# Patient Record
Sex: Male | Born: 1955 | Race: White | Hispanic: No | Marital: Married
Health system: Midwestern US, Academic
[De-identification: ages and names within clinical notes are randomized; demographics above are authoritative.]

---

## 2017-08-30 LAB — COMPREHENSIVE METABOLIC PANEL
Lab: 15 — ABNORMAL HIGH (ref 0–14)
Lab: 23
Lab: 33
Lab: 4
Lab: 76

## 2017-09-03 ENCOUNTER — Encounter: Admit: 2017-09-03 | Discharge: 2017-09-03 | Payer: BC Managed Care – PPO

## 2017-09-03 DIAGNOSIS — I10 Essential (primary) hypertension: Principal | ICD-10-CM

## 2017-09-09 ENCOUNTER — Encounter: Admit: 2017-09-09 | Discharge: 2017-09-09 | Payer: BC Managed Care – PPO

## 2017-09-10 ENCOUNTER — Encounter: Admit: 2017-09-10 | Discharge: 2017-09-10 | Payer: BC Managed Care – PPO

## 2017-09-10 ENCOUNTER — Ambulatory Visit: Admit: 2017-09-10 | Discharge: 2017-09-11

## 2017-09-10 DIAGNOSIS — I10 Essential (primary) hypertension: Principal | ICD-10-CM

## 2017-09-11 ENCOUNTER — Encounter: Admit: 2017-09-11 | Discharge: 2017-09-11 | Payer: BC Managed Care – PPO

## 2017-09-11 DIAGNOSIS — E785 Hyperlipidemia, unspecified: ICD-10-CM

## 2017-09-11 DIAGNOSIS — I10 Essential (primary) hypertension: Principal | ICD-10-CM

## 2017-09-17 ENCOUNTER — Ambulatory Visit: Admit: 2017-09-17 | Discharge: 2017-09-18

## 2017-09-17 ENCOUNTER — Encounter: Admit: 2017-09-17 | Discharge: 2017-09-17 | Payer: BC Managed Care – PPO

## 2017-09-17 DIAGNOSIS — I10 Essential (primary) hypertension: Principal | ICD-10-CM

## 2017-09-17 DIAGNOSIS — E7801 Familial hypercholesterolemia: ICD-10-CM

## 2017-09-17 DIAGNOSIS — E785 Hyperlipidemia, unspecified: ICD-10-CM

## 2017-09-17 NOTE — Assessment & Plan Note
His average blood pressure at home is just over 130/80.  He will start a regular exercise program at the [facility] and I told him I thought it was reasonable to use lifestyle modification initially but that the goal for his blood pressure will be less than 130/80.

## 2017-09-17 NOTE — Progress Notes
Date of Service: 09/17/2017    Fred Miller is a 61 y.o. male.       HPI     Fred Miller was in the [place] office today for consultation regarding cardiovascular risk management.  His father is a patient I have seen in the hospital at [place] and Fred Miller does have a significant family history of vascular disease.    His recent lipid profile showed that his LDL cholesterol had gone up to over 190 and Dr. Andreas Newport started the patient on Crestor about a week ago.    The patient has had mildly elevated blood pressure readings in the doctor's office.  He got a home blood pressure monitor and brought in a record of his readings.  The average of his blood pressures at home is just over 130/80.    I do not think the patient is having any symptoms at this time he denies any problems with chest discomfort or breathlessness.  He has had no palpitations, syncope, or near syncope.  He denies claudication or TIA symptoms.    He readily admits that he does not get any exercise and we talked about having him engage the services of a personal trainer out at the [facility].           Vitals:    09/17/17 1419 09/17/17 1429   BP: (!) 148/98 (!) 150/94   Pulse: 64    Weight: 77.7 kg (171 lb 3.2 oz)    Height: 1.753 m (5' 9)      Body mass index is 25.28 kg/m???.     Past Medical History  Patient Active Problem List    Diagnosis Date Noted   ??? Essential hypertension 09/11/2017   ??? Hyperlipemia 09/11/2017         Review of Systems   Constitution: Negative.   HENT: Negative.    Eyes: Negative.    Cardiovascular: Negative.    Respiratory: Negative.    Endocrine: Negative.    Hematologic/Lymphatic: Negative.    Skin: Negative.    Musculoskeletal: Negative.    Gastrointestinal: Negative.    Genitourinary: Negative.    Neurological: Negative.    Psychiatric/Behavioral: Negative.    Allergic/Immunologic: Negative.        Physical Exam    Physical Exam   General Appearance: no distress   Skin: warm, no ulcers or xanthomas Digits and Nails: no cyanosis or clubbing   Eyes: conjunctivae and lids normal, pupils are equal and round   Teeth/Gums/Palate: dentition unremarkable, no lesions   Lips & Oral Mucosa: no pallor or cyanosis   Neck Veins: normal JVP , neck veins are not distended   Thyroid: no nodules, masses, tenderness or enlargement   Chest Inspection: chest is normal in appearance   Respiratory Effort: breathing comfortably, no respiratory distress   Auscultation/Percussion: lungs clear to auscultation, no rales or rhonchi, no wheezing   PMI: PMI not enlarged or displaced   Cardiac Rhythm: regular rhythm and normal rate   Cardiac Auscultation: S1, S2 normal, no rub, no gallop   Murmurs: no murmur   Peripheral Circulation: normal peripheral circulation   Carotid Arteries: normal carotid upstroke bilaterally, no bruits   Radial Arteries: normal symmetric radial pulses   Abdominal Aorta: no abdominal aortic bruit   Pedal Pulses: normal symmetric pedal pulses   Lower Extremity Edema: no lower extremity edema   Abdominal Exam: soft, non-tender, no masses, bowel sounds normal   Liver & Spleen: no organomegaly   Gait & Station:  walks without assistance   Muscle Strength: normal muscle tone   Orientation: oriented to time, place and person   Affect & Mood: appropriate and sustained affect   Language and Memory: patient responsive and seems to comprehend information   Neurologic Exam: neurological assessment grossly intact   Other: moves all extremities      Cardiovascular Studies    EKG:  Sinus rhythm, rate 64.  Normal tracing.    Problems Addressed Today  Encounter Diagnoses   Name Primary?   ??? Essential hypertension    ??? Familial hypercholesterolemia        Assessment and Plan       Essential hypertension  His average blood pressure at home is just over 130/80.  He will start a regular exercise program at the [facility] and I told him I thought it was reasonable to use lifestyle modification initially but that the goal for his blood pressure will be less than 130/80.    Hyperlipemia  I told him that I certainly agree with the use of a statin for his LDL cholesterol of higher than 190.    I also talked to him about using a coronary calcium scan as a risk stratification tool.  If he does have a significant amount of calcification we should probably target his LDL as low as 70.  If his total calcium score is higher than 400 I told him that I would recommend a stress test, but if the calcium score is lower than that I do not think we necessarily need to use a stress test because he is not having symptoms currently.      Current Medications (including today's revisions)  ??? aspirin EC 81 mg tablet Take 81 mg by mouth daily. Take with food.   ??? cholecalciferol (VITAMIN D-3) 400 unit tab tablet Take 400 Units by mouth daily.   ??? cyanocobalamin (VITAMIN B-12) 100 mcg tablet Take 100 mcg by mouth daily.   ??? folic acid/multivit-min/lutein (CENTRUM SILVER PO) Take 1 tablet by mouth daily.   ??? rosuvastatin (CRESTOR) 10 mg tablet Take 1 tablet by mouth daily.

## 2017-09-17 NOTE — Assessment & Plan Note
I told him that I certainly agree with the use of a statin for his LDL cholesterol of higher than 190.    I also talked to him about using a coronary calcium scan as a risk stratification tool.  If he does have a significant amount of calcification we should probably target his LDL as low as 70.  If his total calcium score is higher than 400 I told him that I would recommend a stress test, but if the calcium score is lower than that I do not think we necessarily need to use a stress test because he is not having symptoms currently.

## 2018-03-24 ENCOUNTER — Ambulatory Visit: Admit: 2018-03-24 | Discharge: 2018-03-24 | Payer: BC Managed Care – PPO

## 2018-03-24 ENCOUNTER — Encounter: Admit: 2018-03-24 | Discharge: 2018-03-24 | Payer: BC Managed Care – PPO

## 2018-03-24 DIAGNOSIS — E872 Acidosis: Secondary | ICD-10-CM

## 2018-03-24 DIAGNOSIS — E785 Hyperlipidemia, unspecified: Principal | ICD-10-CM

## 2018-03-24 DIAGNOSIS — W1789XA Other fall from one level to another, initial encounter: ICD-10-CM

## 2018-03-24 MED ORDER — ONDANSETRON HCL (PF) 4 MG/2 ML IJ SOLN
4 mg | INTRAVENOUS | 0 refills | Status: DC | PRN
Start: 2018-03-24 — End: 2018-03-26

## 2018-03-24 MED ORDER — IOHEXOL 350 MG IODINE/ML IV SOLN
100 mL | Freq: Once | INTRAVENOUS | 0 refills | Status: CP
Start: 2018-03-24 — End: ?
  Administered 2018-03-25: 02:00:00 100 mL via INTRAVENOUS

## 2018-03-24 MED ORDER — SODIUM CHLORIDE 0.9 % IJ SOLN
50 mL | Freq: Once | INTRAVENOUS | 0 refills | Status: CP
Start: 2018-03-24 — End: ?
  Administered 2018-03-25: 02:00:00 50 mL via INTRAVENOUS

## 2018-03-24 MED ORDER — FENTANYL CITRATE (PF) 50 MCG/ML IJ SOLN
25-50 ug | INTRAVENOUS | 0 refills | Status: DC | PRN
Start: 2018-03-24 — End: 2018-03-25

## 2018-03-24 MED ORDER — FENTANYL CITRATE (PF) 50 MCG/ML IJ SOLN
50 ug | Freq: Once | INTRAVENOUS | 0 refills | Status: CP
Start: 2018-03-24 — End: ?
  Administered 2018-03-25: 02:00:00 50 ug via INTRAVENOUS

## 2018-03-24 MED ORDER — LACTATED RINGERS IV SOLP
INTRAVENOUS | 0 refills | Status: DC
Start: 2018-03-24 — End: 2018-03-25
  Administered 2018-03-25: 06:00:00 1000.000 mL via INTRAVENOUS

## 2018-03-24 MED ORDER — LACTATED RINGERS IV SOLP
1000 mL | INTRAVENOUS | 0 refills | Status: CP
Start: 2018-03-24 — End: ?
  Administered 2018-03-25: 03:00:00 1000 mL via INTRAVENOUS

## 2018-03-25 ENCOUNTER — Encounter: Admit: 2018-03-25 | Discharge: 2018-03-25 | Payer: BC Managed Care – PPO

## 2018-03-25 ENCOUNTER — Emergency Department: Admit: 2018-03-25 | Discharge: 2018-03-25 | Payer: BC Managed Care – PPO

## 2018-03-25 ENCOUNTER — Inpatient Hospital Stay: Admit: 2018-03-25 | Discharge: 2018-03-25 | Payer: BC Managed Care – PPO

## 2018-03-25 ENCOUNTER — Inpatient Hospital Stay
Admit: 2018-03-25 | Discharge: 2018-03-25 | Disposition: A | Discharge status: Home / Self Care | Payer: BC Managed Care – PPO | Source: Other Acute Inpatient Hospital

## 2018-03-25 ENCOUNTER — Emergency Department: Admit: 2018-03-24 | Discharge: 2018-03-24 | Payer: BC Managed Care – PPO

## 2018-03-25 ENCOUNTER — Ambulatory Visit: Admit: 2018-03-25 | Discharge: 2018-03-25 | Payer: BC Managed Care – PPO

## 2018-03-25 DIAGNOSIS — S22069A Unspecified fracture of T7-T8 vertebra, initial encounter for closed fracture: ICD-10-CM

## 2018-03-25 DIAGNOSIS — G8911 Acute pain due to trauma: ICD-10-CM

## 2018-03-25 DIAGNOSIS — S0121XA Laceration without foreign body of nose, initial encounter: ICD-10-CM

## 2018-03-25 DIAGNOSIS — R40241 Glasgow coma scale score 13-15, unspecified time: ICD-10-CM

## 2018-03-25 DIAGNOSIS — S0101XA Laceration without foreign body of scalp, initial encounter: ICD-10-CM

## 2018-03-25 DIAGNOSIS — S12100A Unspecified displaced fracture of second cervical vertebra, initial encounter for closed fracture: ICD-10-CM

## 2018-03-25 DIAGNOSIS — S01511A Laceration without foreign body of lip, initial encounter: ICD-10-CM

## 2018-03-25 DIAGNOSIS — E872 Acidosis: ICD-10-CM

## 2018-03-25 DIAGNOSIS — S3991XA Unspecified injury of abdomen, initial encounter: ICD-10-CM

## 2018-03-25 DIAGNOSIS — E785 Hyperlipidemia, unspecified: ICD-10-CM

## 2018-03-25 DIAGNOSIS — S0181XA Laceration without foreign body of other part of head, initial encounter: ICD-10-CM

## 2018-03-25 DIAGNOSIS — I10 Essential (primary) hypertension: ICD-10-CM

## 2018-03-25 DIAGNOSIS — S12300A Unspecified displaced fracture of fourth cervical vertebra, initial encounter for closed fracture: Principal | ICD-10-CM

## 2018-03-25 DIAGNOSIS — M25511 Pain in right shoulder: ICD-10-CM

## 2018-03-25 DIAGNOSIS — S022XXA Fracture of nasal bones, initial encounter for closed fracture: ICD-10-CM

## 2018-03-25 LAB — BETA-HCG: Lab: <1 U/L — ABNORMAL HIGH (ref <5)

## 2018-03-25 LAB — BASIC METABOLIC PANEL
Lab: 1 mg/dL (ref 0.4–1.24)
Lab: 140 MMOL/L (ref 137–147)
Lab: 144 mg/dL — ABNORMAL HIGH (ref 70–100)
Lab: 20 mg/dL (ref 7–25)
Lab: 23 MMOL/L (ref 21–30)
Lab: 8.9 mg/dL (ref 8.5–10.6)
Lab: >60 mL/min (ref >60)
Lab: >60 mL/min (ref >60)
Lab: 0.9 mg/dL — ABNORMAL LOW (ref >60)
Lab: 10 g/dL — ABNORMAL LOW (ref 3–12)
Lab: 103 MMOL/L — ABNORMAL LOW (ref >60)
Lab: 138 MMOL/L — ABNORMAL LOW (ref 137–147)
Lab: 165 mg/dL — ABNORMAL HIGH (ref >60)
Lab: 8.8 mg/dL — ABNORMAL HIGH (ref >60)
Lab: >60 mL/min (ref >60)
Lab: >60 mL/min — ABNORMAL LOW (ref >60)

## 2018-03-25 LAB — PHOSPHORUS: Lab: 3.8 mg/dL (ref >60)

## 2018-03-25 LAB — MAGNESIUM: Lab: 1.6 mg/dL — ABNORMAL LOW (ref 1.6–2.6)

## 2018-03-25 LAB — ALCOHOL LEVEL: Lab: 31 mg/dL (ref 98–110)

## 2018-03-25 LAB — LACTIC ACID (BG - RAPID LACTATE): Lab: 4.3 MMOL/L — ABNORMAL HIGH (ref 0.5–2.0)

## 2018-03-25 LAB — CBC
Lab: 12 10*3/uL — ABNORMAL HIGH (ref 4.5–11.0)
Lab: 8.2 10*3/uL — ABNORMAL HIGH (ref >60)

## 2018-03-25 LAB — LACTIC ACID(LACTATE): Lab: 1.9 MMOL/L (ref 0.5–2.0)

## 2018-03-25 LAB — PROTIME INR (PT): Lab: 1 M/UL (ref 0.8–1.2)

## 2018-03-25 LAB — PTT (APTT): Lab: 23 s — ABNORMAL LOW (ref 24.0–36.5)

## 2018-03-25 MED ORDER — SENNOSIDES 8.6 MG PO TAB
1 | ORAL_TABLET | Freq: Two times a day (BID) | ORAL | 3 refills | Status: AC
Start: 2018-03-25 — End: 2018-04-08

## 2018-03-25 MED ORDER — POLYETHYLENE GLYCOL 3350 17 GRAM PO PWPK
17 g | Freq: Every day | ORAL | 0 refills | 18.00000 days | Status: AC
Start: 2018-03-25 — End: 2018-04-08

## 2018-03-25 MED ORDER — POLYETHYLENE GLYCOL 3350 17 GRAM PO PWPK
1 | Freq: Every day | ORAL | 0 refills | Status: DC
Start: 2018-03-25 — End: 2018-03-26

## 2018-03-25 MED ORDER — TRAMADOL 50 MG PO TAB
50 mg | ORAL | 0 refills | Status: DC | PRN
Start: 2018-03-25 — End: 2018-03-26
  Administered 2018-03-25: 21:00:00 50 mg via ORAL

## 2018-03-25 MED ORDER — ENOXAPARIN 30 MG/0.3 ML SC SYRG
30 mg | Freq: Two times a day (BID) | SUBCUTANEOUS | 0 refills | Status: DC
Start: 2018-03-25 — End: 2018-03-26
  Administered 2018-03-25: 14:00:00 30 mg via SUBCUTANEOUS

## 2018-03-25 MED ORDER — ACETAMINOPHEN 325 MG PO TAB
650 mg | ORAL | 0 refills | Status: AC | PRN
Start: 2018-03-25 — End: ?

## 2018-03-25 MED ORDER — TRAMADOL 50 MG PO TAB
50 mg | ORAL_TABLET | ORAL | 0 refills | Status: AC | PRN
Start: 2018-03-25 — End: 2018-05-06
  Filled 2018-03-25 (×2): qty 30, 7d supply, fill #1

## 2018-03-25 MED ORDER — SENNOSIDES 8.6 MG PO TAB
1 | Freq: Two times a day (BID) | ORAL | 0 refills | Status: DC
Start: 2018-03-25 — End: 2018-03-26
  Administered 2018-03-25: 14:00:00 1 via ORAL

## 2018-03-25 MED ORDER — DIPHENHYDRAMINE HCL 50 MG/ML IJ SOLN
50 mg | Freq: Once | INTRAVENOUS | 0 refills | Status: DC
Start: 2018-03-25 — End: 2018-03-25

## 2018-03-25 MED ORDER — ACETAMINOPHEN 325 MG PO TAB
650 mg | ORAL | 0 refills | Status: DC
Start: 2018-03-25 — End: 2018-03-26
  Administered 2018-03-25 (×2): 650 mg via ORAL

## 2018-03-26 ENCOUNTER — Encounter: Admit: 2018-03-26 | Discharge: 2018-03-26 | Payer: BC Managed Care – PPO

## 2018-03-26 DIAGNOSIS — I10 Essential (primary) hypertension: Principal | ICD-10-CM

## 2018-03-26 DIAGNOSIS — E785 Hyperlipidemia, unspecified: Secondary | ICD-10-CM

## 2018-04-04 ENCOUNTER — Encounter: Admit: 2018-04-04 | Discharge: 2018-04-04 | Payer: BC Managed Care – PPO

## 2018-04-04 DIAGNOSIS — W1789XA Other fall from one level to another, initial encounter: Principal | ICD-10-CM

## 2018-04-08 ENCOUNTER — Ambulatory Visit: Admit: 2018-04-08 | Discharge: 2018-04-08 | Payer: BC Managed Care – PPO

## 2018-04-08 DIAGNOSIS — S22060B Wedge compression fracture of T7-T8 vertebra, initial encounter for open fracture: Principal | ICD-10-CM

## 2018-04-08 DIAGNOSIS — W1789XA Other fall from one level to another, initial encounter: ICD-10-CM

## 2018-04-15 ENCOUNTER — Ambulatory Visit: Admit: 2018-04-15 | Discharge: 2018-04-15 | Payer: BC Managed Care – PPO

## 2018-04-15 ENCOUNTER — Encounter: Admit: 2018-04-15 | Discharge: 2018-04-15 | Payer: BC Managed Care – PPO

## 2018-04-15 DIAGNOSIS — I10 Essential (primary) hypertension: Principal | ICD-10-CM

## 2018-04-15 DIAGNOSIS — E785 Hyperlipidemia, unspecified: ICD-10-CM

## 2018-04-15 DIAGNOSIS — Z4802 Encounter for removal of sutures: ICD-10-CM

## 2018-04-15 DIAGNOSIS — Z5189 Encounter for other specified aftercare: Principal | ICD-10-CM

## 2018-04-15 MED ORDER — BACITRACIN ZINC 500 UNIT/GRAM TP OINT
Freq: Every day | TOPICAL | 0 refills | 14.00000 days | Status: AC
Start: 2018-04-15 — End: 2018-05-06

## 2018-04-15 MED ORDER — CEPHALEXIN 500 MG PO CAP
500 mg | ORAL_CAPSULE | Freq: Three times a day (TID) | ORAL | 0 refills | Status: AC
Start: 2018-04-15 — End: ?

## 2018-05-01 ENCOUNTER — Encounter: Admit: 2018-05-01 | Discharge: 2018-05-01 | Payer: BC Managed Care – PPO

## 2018-05-01 DIAGNOSIS — S22060B Wedge compression fracture of T7-T8 vertebra, initial encounter for open fracture: Principal | ICD-10-CM

## 2018-05-06 ENCOUNTER — Ambulatory Visit: Admit: 2018-05-06 | Discharge: 2018-05-06 | Payer: BC Managed Care – PPO

## 2018-05-06 ENCOUNTER — Encounter: Admit: 2018-05-06 | Discharge: 2018-05-06 | Payer: BC Managed Care – PPO

## 2018-05-06 DIAGNOSIS — S22060B Wedge compression fracture of T7-T8 vertebra, initial encounter for open fracture: Principal | ICD-10-CM

## 2018-05-06 DIAGNOSIS — I10 Essential (primary) hypertension: Principal | ICD-10-CM

## 2018-05-06 DIAGNOSIS — E785 Hyperlipidemia, unspecified: ICD-10-CM

## 2018-07-16 IMAGING — CR CHEST
2 series · 2 of 2 positions shown · non-contrast
Comparison: none

[chest pa x-wise]
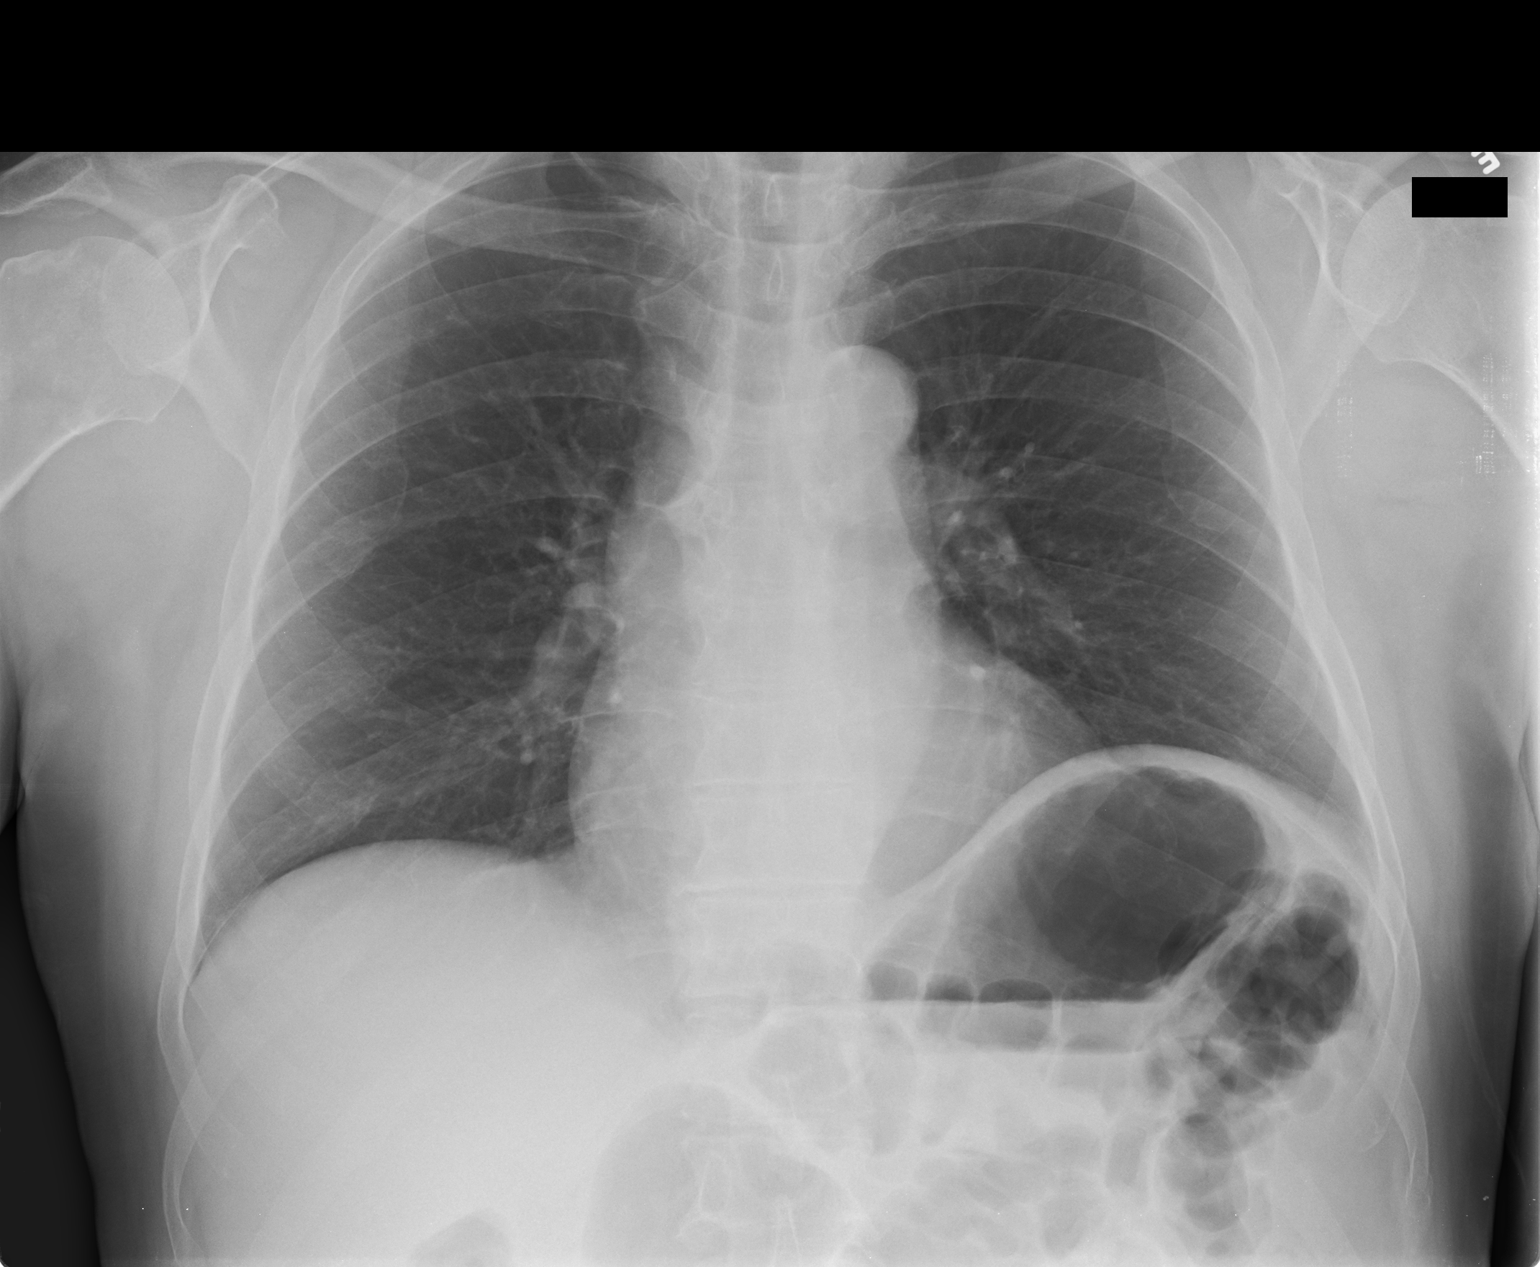

[chest lat]
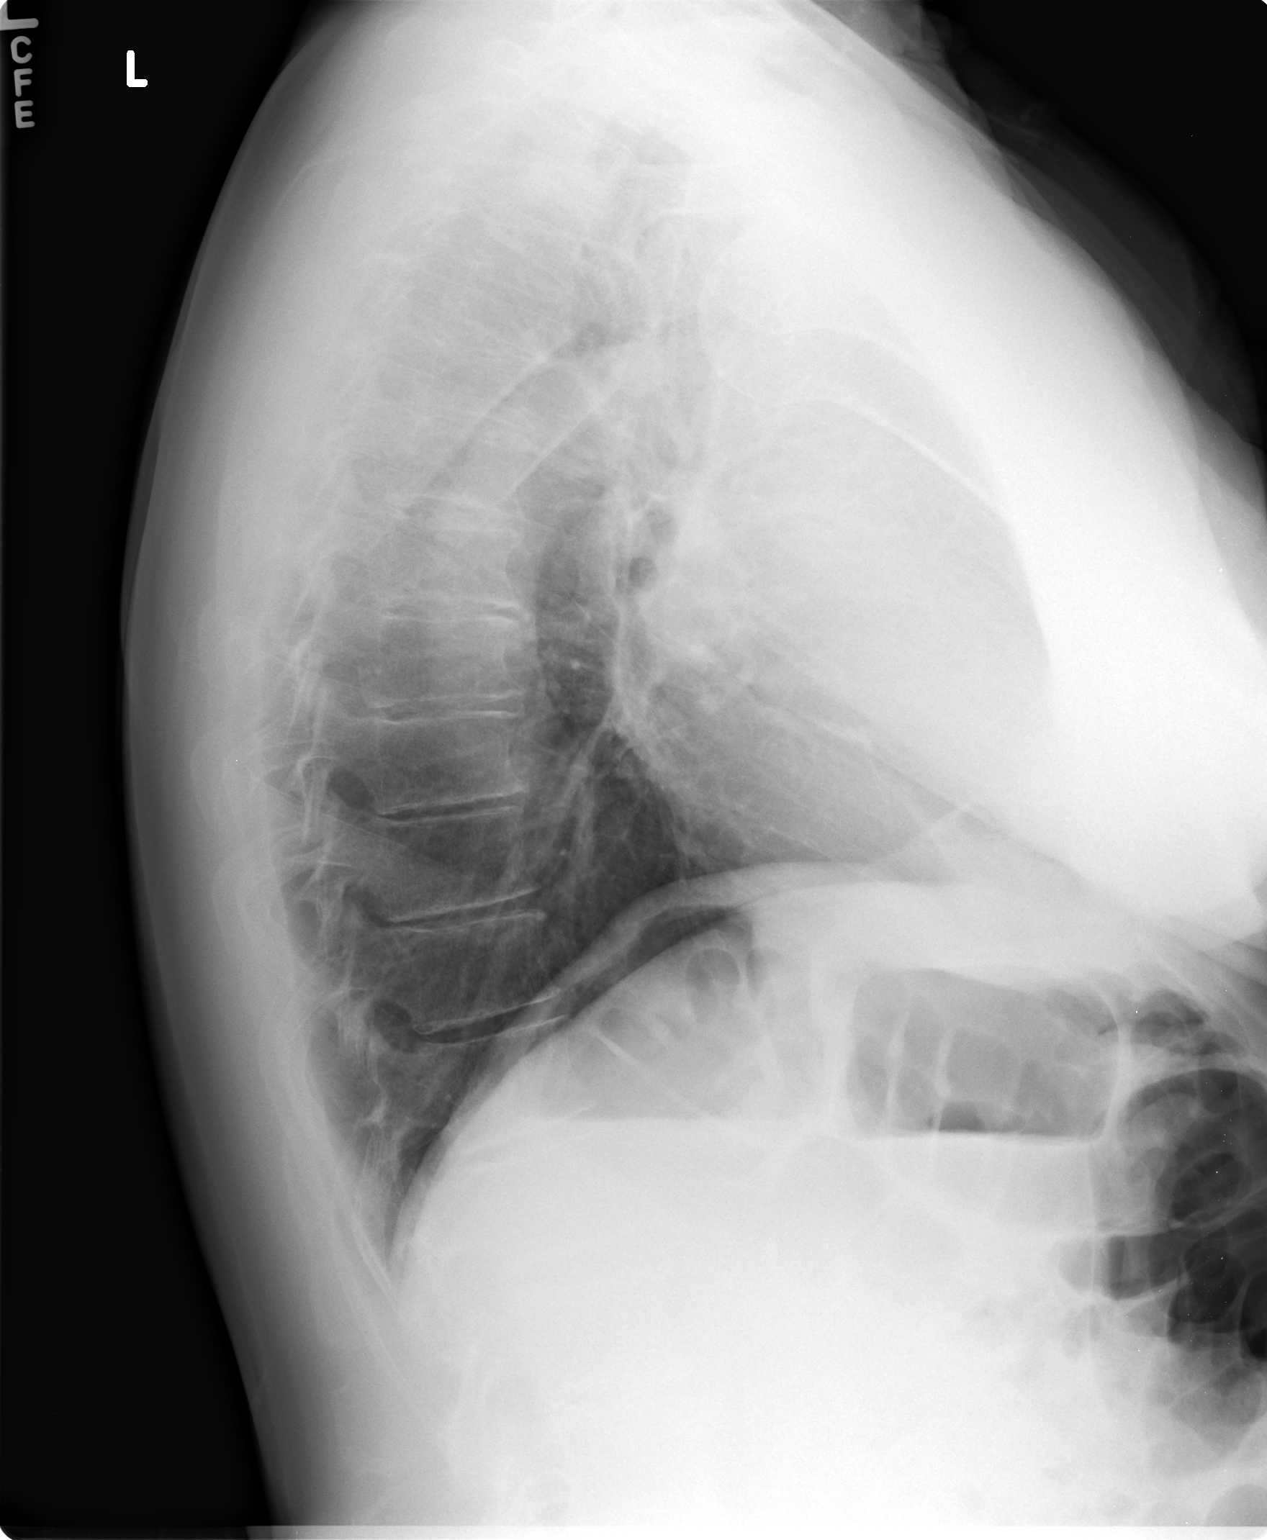

[2 of 2 positions shown; findings below may reference images not displayed]

DIAGNOSTIC STUDIES

EXAM

RADIOLOGICAL EXAMINATION, CHEST; 2 VIEWS FRONTAL AND LATERAL CPT 14181

INDICATION

61-year-old male with hypertension.

TECHNIQUE

2 views of the chest were acquired.

COMPARISONS

No prior studies are available for comparison.

FINDINGS

The cardiac silhouette is within normal limits for size. The mediastinum is not widened or
deviated. The lungs are clear and the costophrenic sulci are sharp. Pulmonary vasculature is normal
caliber. No pneumothorax is identified.

IMPRESSION

No acute cardiopulmonary process.

## 2018-09-22 ENCOUNTER — Encounter: Admit: 2018-09-22 | Discharge: 2018-09-22 | Payer: BC Managed Care – PPO

## 2018-12-16 ENCOUNTER — Encounter: Admit: 2018-12-16 | Discharge: 2018-12-16 | Payer: BC Managed Care – PPO

## 2018-12-16 DIAGNOSIS — R918 Other nonspecific abnormal finding of lung field: Secondary | ICD-10-CM

## 2018-12-24 ENCOUNTER — Encounter: Admit: 2018-12-24 | Discharge: 2019-01-06 | Payer: BC Managed Care – PPO

## 2018-12-24 LAB — POC GLUCOSE: Lab: 86 mg/dL (ref 70–100)

## 2018-12-24 MED ORDER — RP DX F-18 FDG MCI
10 | Freq: Once | INTRAVENOUS | 0 refills | Status: CP
Start: 2018-12-24 — End: ?
  Administered 2018-12-24: 14:00:00 9.5 via INTRAVENOUS

## 2019-01-06 DIAGNOSIS — R918 Other nonspecific abnormal finding of lung field: Secondary | ICD-10-CM

## 2020-02-11 ENCOUNTER — Encounter: Admit: 2020-02-11 | Discharge: 2020-02-11 | Payer: BC Managed Care – PPO

## 2020-02-11 NOTE — Progress Notes
NM PET Scan dated 12/24/2018 sent to patient's PCP on file. Confirmation of document received dated 12/24/2018. Phone calls placed to PCP office regarding F/U recommendations placed on 09/30/2019 & 12/17/2019. PCP aware of recommendations.
